# Patient Record
Sex: Male | Born: 1960 | Race: White | Hispanic: No | Marital: Married | Smoking: Former smoker
Health system: Southern US, Community
[De-identification: ages and names within clinical notes are randomized; demographics above are authoritative.]

## PROBLEM LIST (undated history)

## (undated) DIAGNOSIS — J449 Chronic obstructive pulmonary disease, unspecified: Secondary | ICD-10-CM

## (undated) DIAGNOSIS — I10 Essential (primary) hypertension: Secondary | ICD-10-CM

## (undated) DIAGNOSIS — E7800 Pure hypercholesterolemia, unspecified: Secondary | ICD-10-CM

---

## 2005-11-28 ENCOUNTER — Ambulatory Visit: Payer: Self-pay | Admitting: Urology

## 2007-08-17 IMAGING — CR DG CHEST 2V
1 series · 3 of 3 positions shown · non-contrast
Comparison: none

REASON FOR EXAM: TESTICULAR CA
COMMENTS:

[Series 1: view not recorded · 0.17mm/px · 3 of 3 slices shown]
[im 1/3]
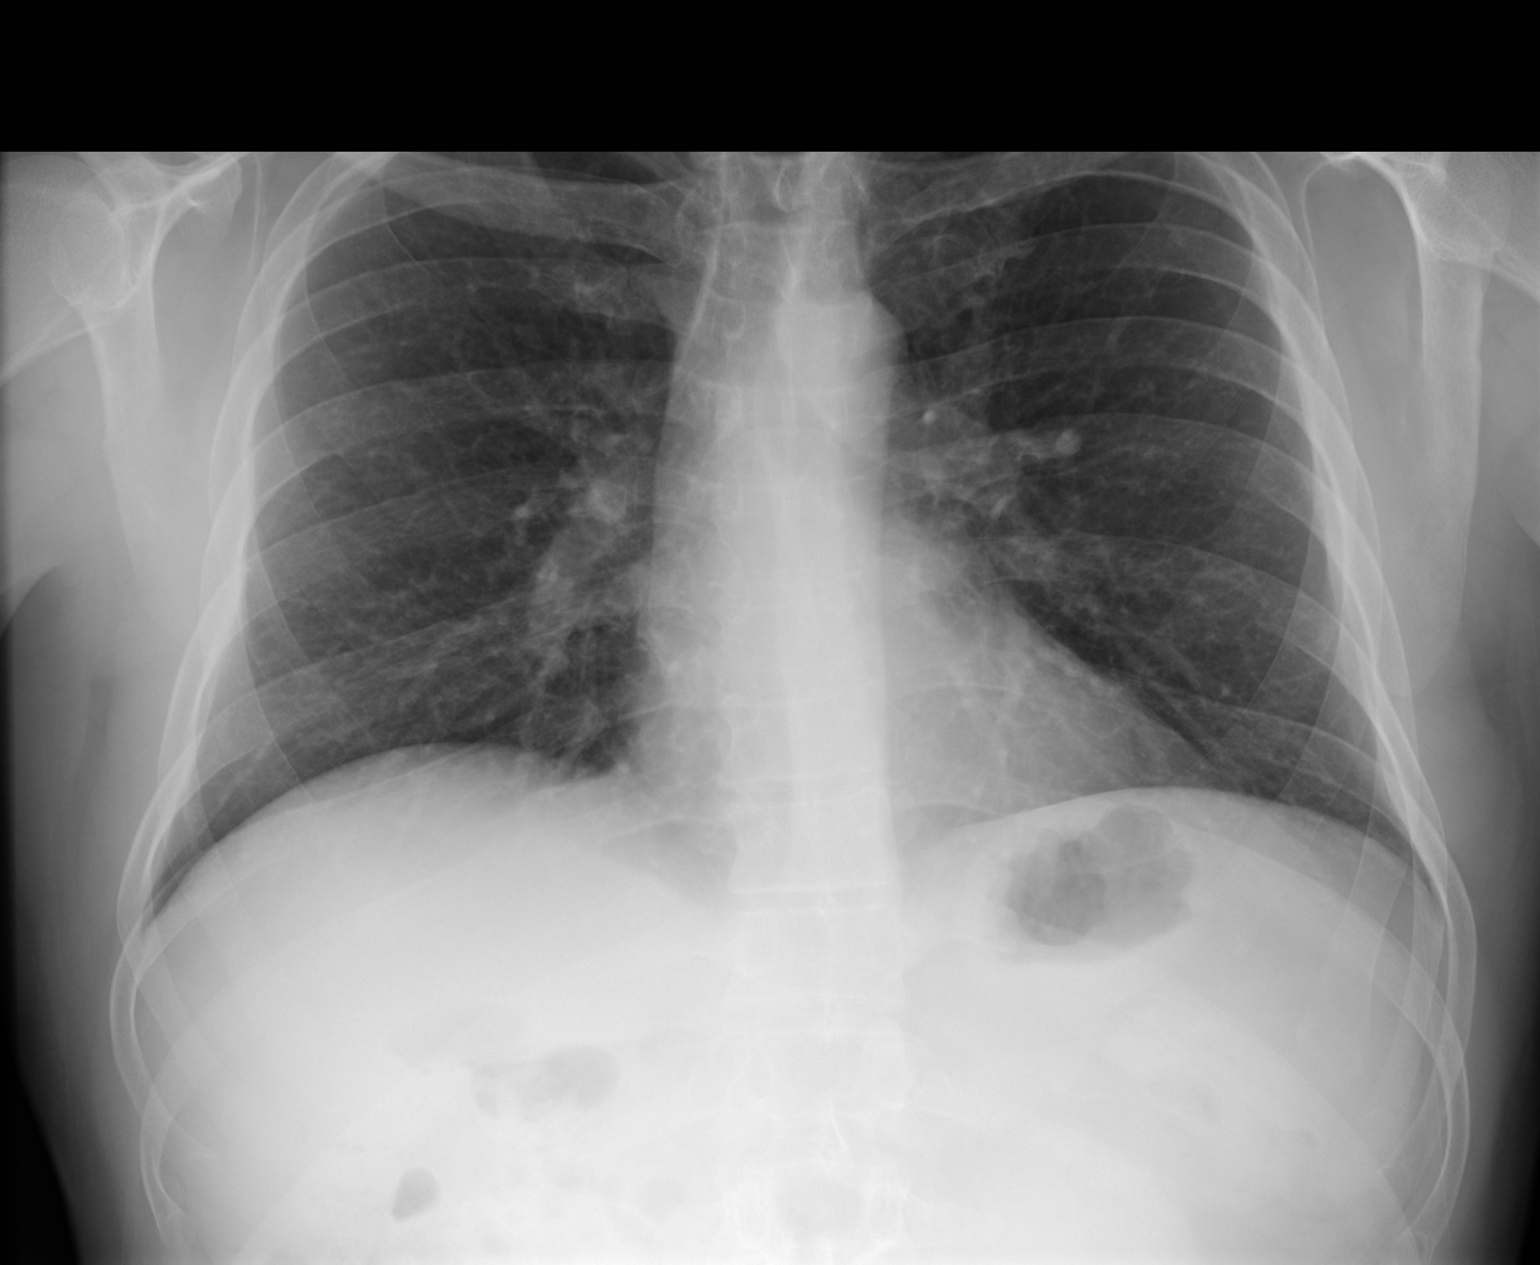
[im 2/3]
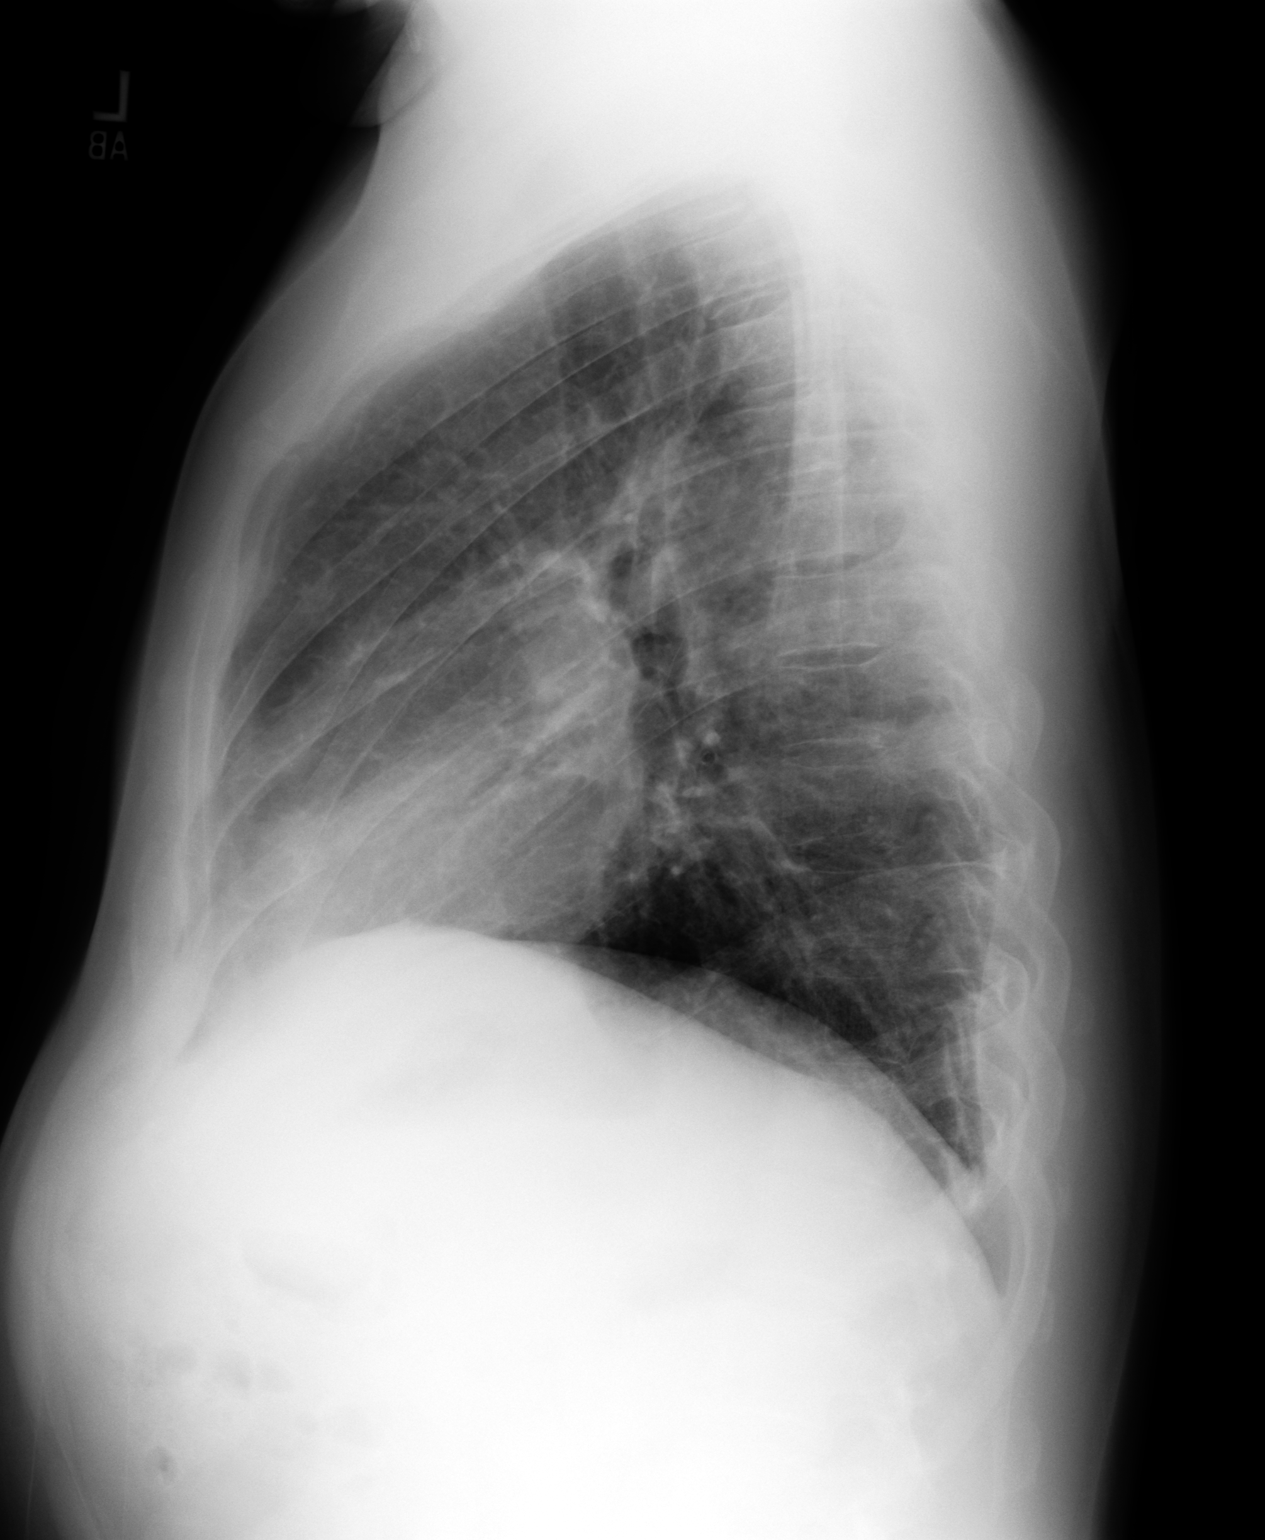
[im 3/3]
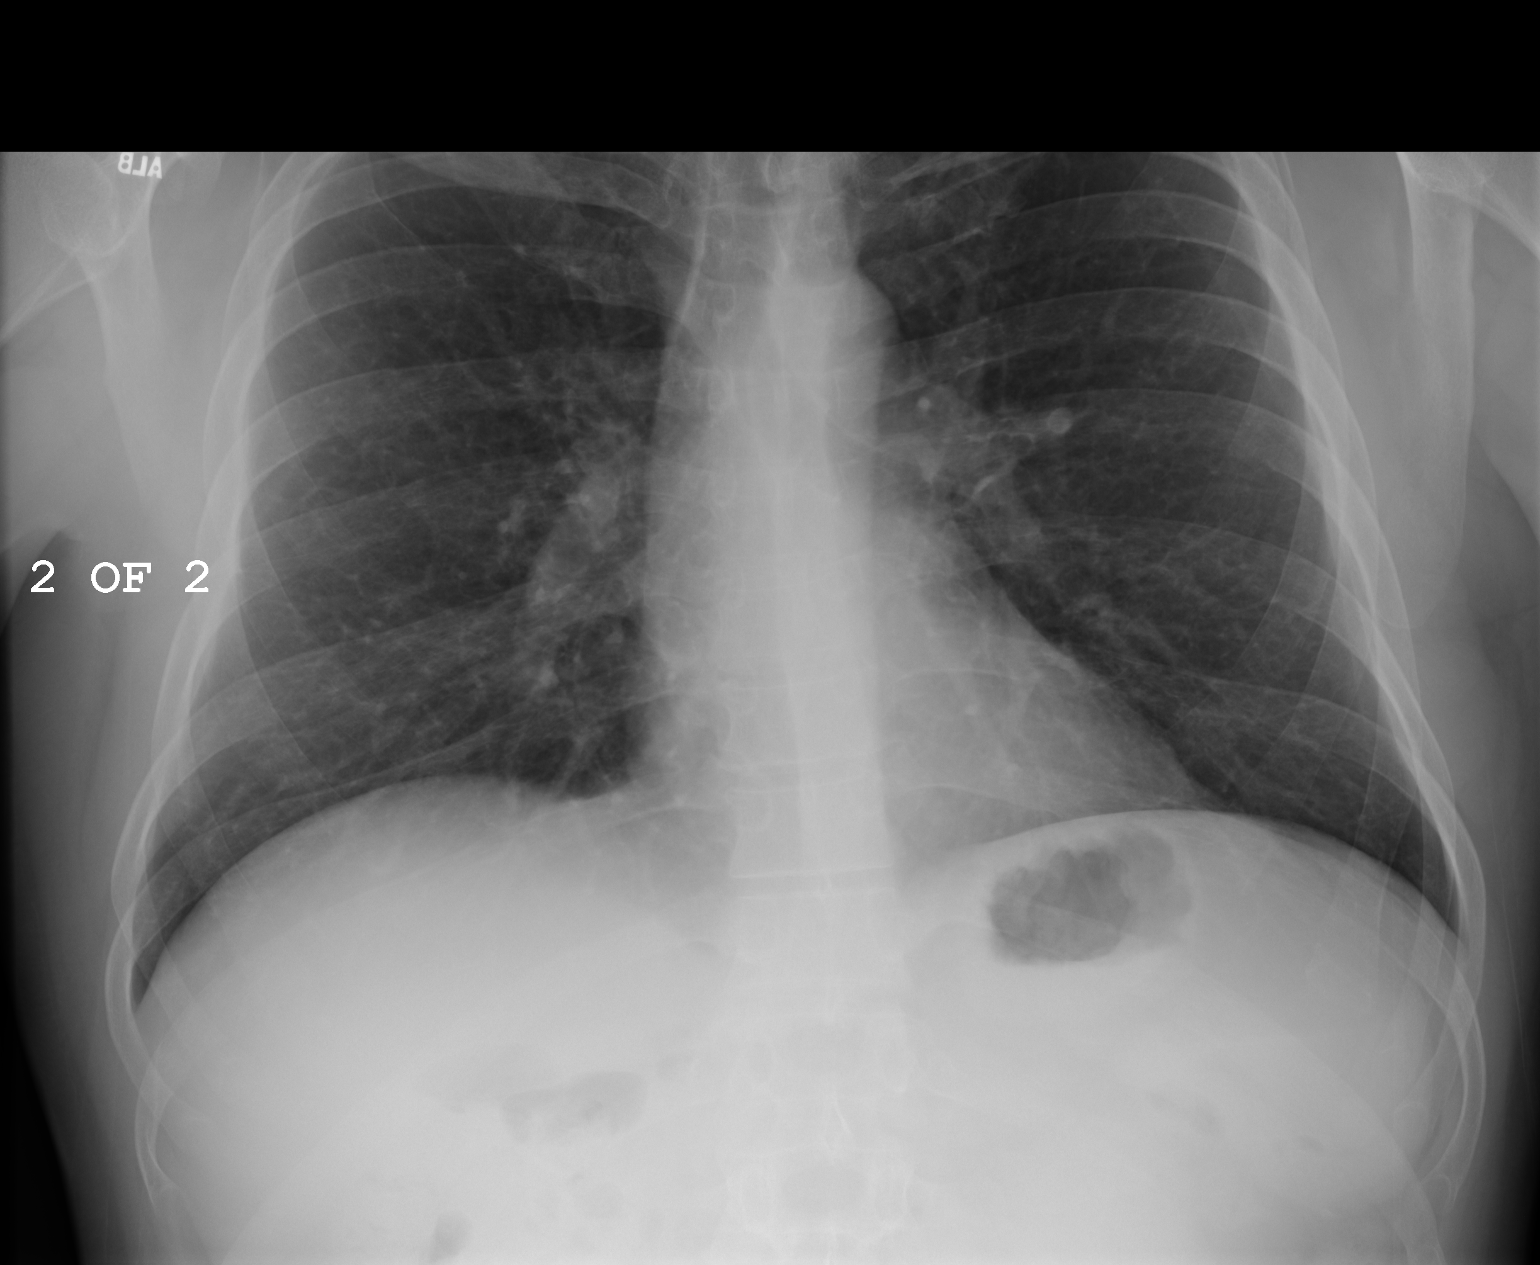

[3 of 3 positions shown; findings below may reference images not displayed]

PROCEDURE:     DXR - DXR CHEST PA (OR AP) AND LATERAL  - November 28, 2005 [DATE]

RESULT:     The lung fields are clear. No pneumonia, pneumothorax or pleural
effusion is seen. No findings suspicious for metastatic disease are
identified. The heart size is normal. The mediastinal and osseous structures
reveal no significant abnormalities.
IMPRESSION: No significant abnormalities are identified.

## 2007-12-27 ENCOUNTER — Ambulatory Visit: Payer: Self-pay | Admitting: Urology

## 2009-09-14 IMAGING — CR DG CHEST 2V
1 series · 2 of 2 positions shown · non-contrast
Comparison: none

REASON FOR EXAM: testicular cancer
COMMENTS:

[Series 1: view not recorded · 0.17mm/px · 2 of 2 slices shown]
[im 1/2]
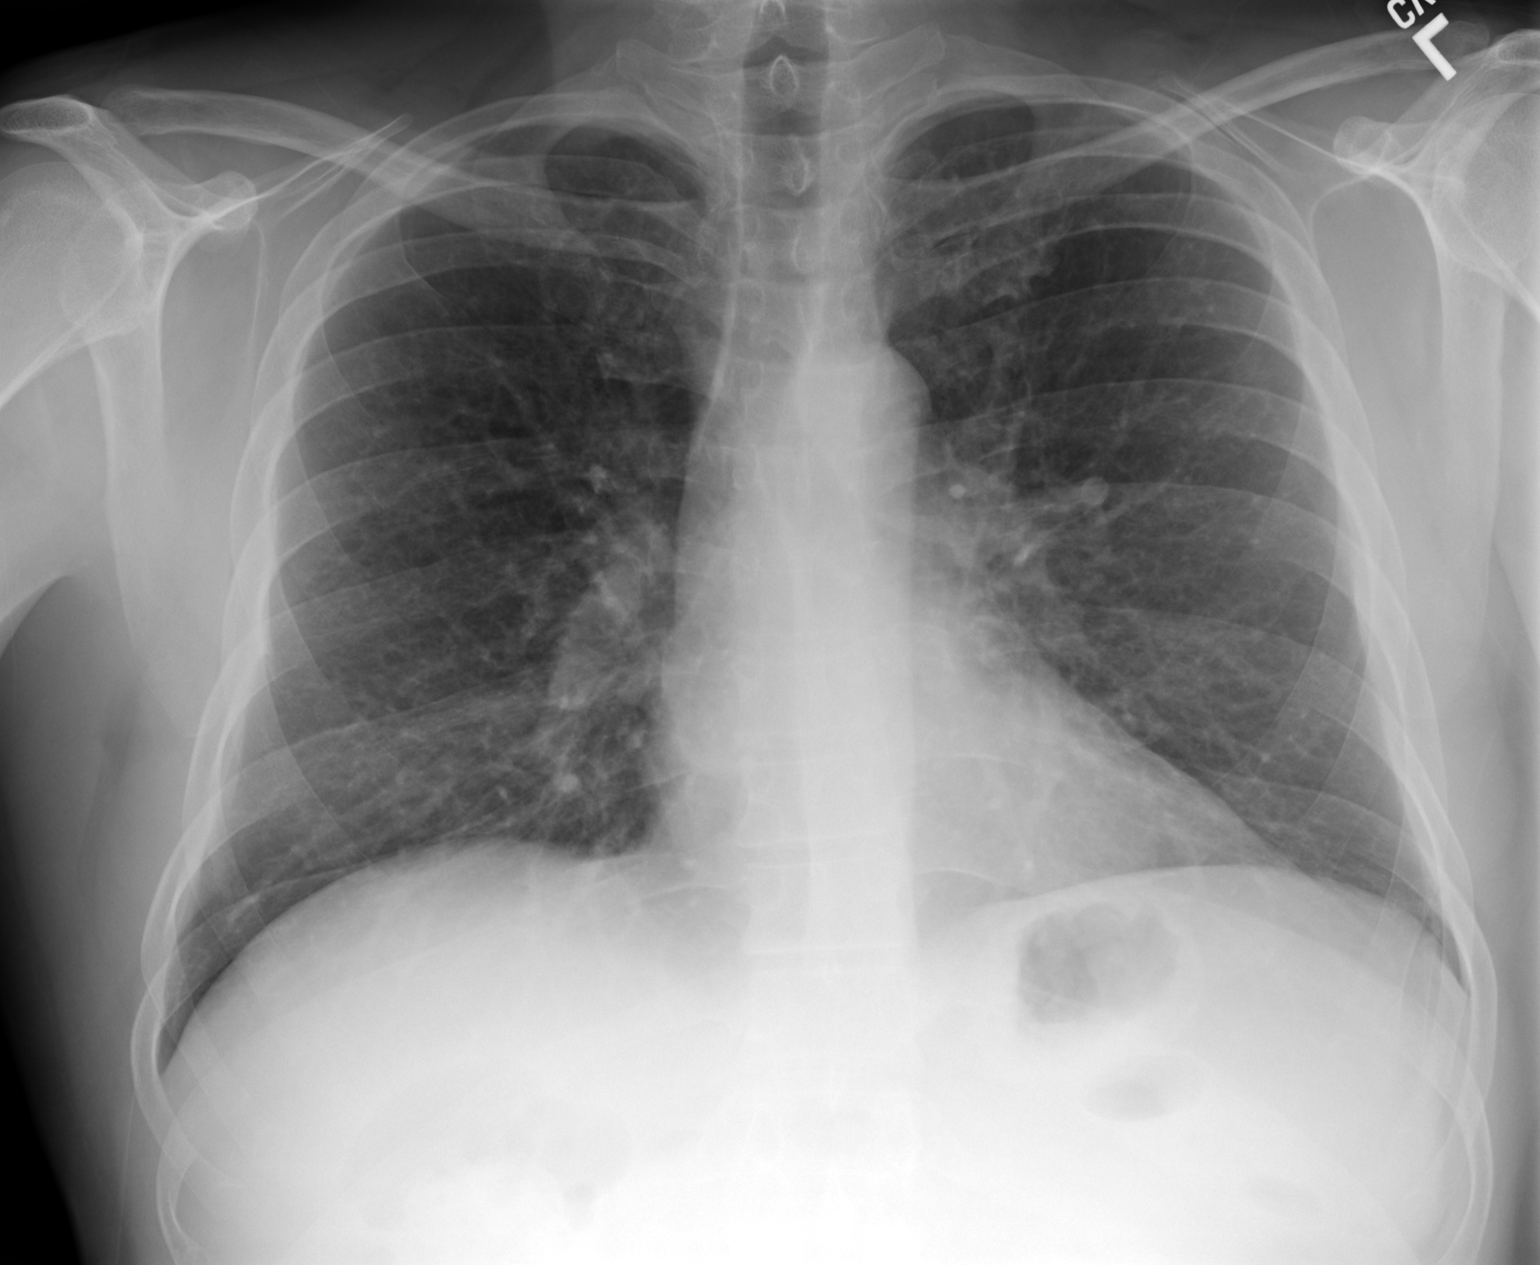
[im 2/2]
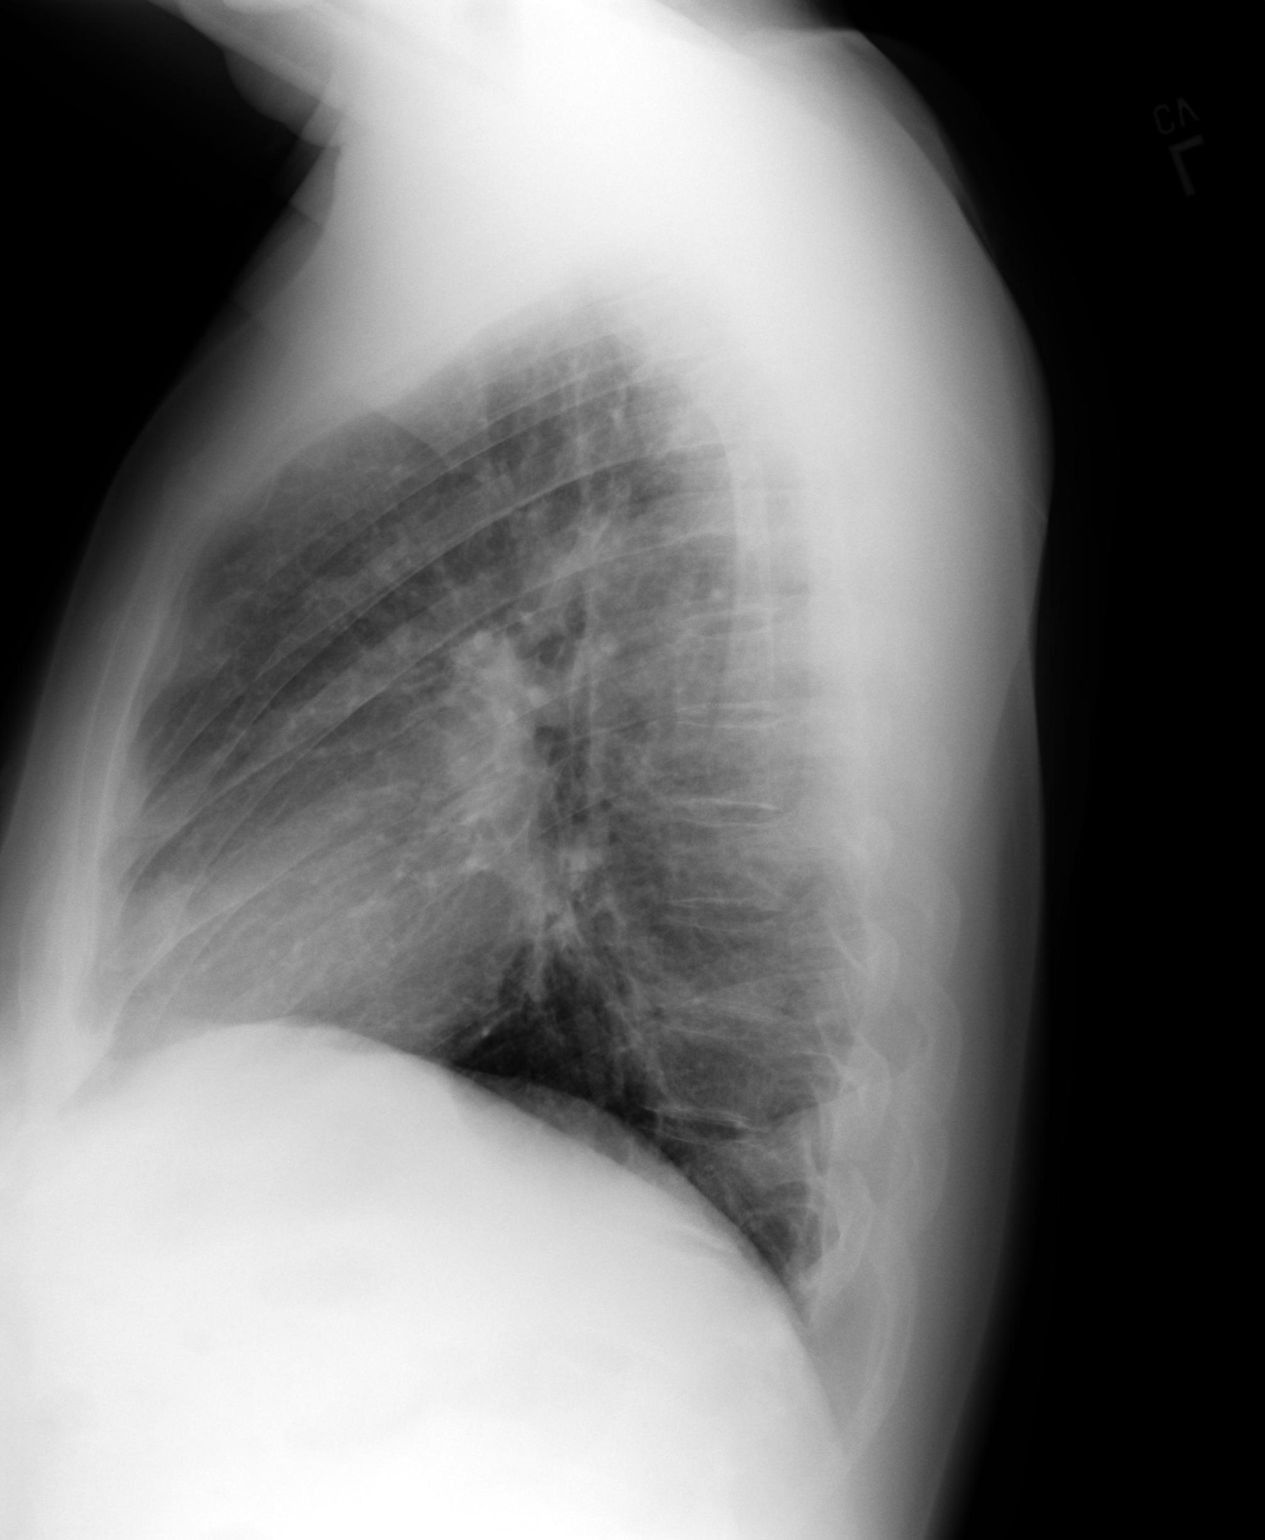

[2 of 2 positions shown; findings below may reference images not displayed]

PROCEDURE:     DXR - DXR CHEST PA (OR AP) AND LATERAL  - December 27, 2007 [DATE]

RESULT:     Comparison is made to the prior exam of 11-28-2005. The lung
fields are clear. No pneumonia, pneumothorax or pleural effusion is seen. No
pulmonary nodularity is identified. Heart size is normal. The mediastinal
and osseous structures show no acute changes.
IMPRESSION: 1. The lung fields are clear.
2. No finding suspicious for metastatic disease is identified.
3. Heart size is normal.

## 2013-11-22 DIAGNOSIS — I10 Essential (primary) hypertension: Secondary | ICD-10-CM | POA: Insufficient documentation

## 2013-11-22 DIAGNOSIS — E7800 Pure hypercholesterolemia, unspecified: Secondary | ICD-10-CM | POA: Insufficient documentation

## 2015-03-04 DIAGNOSIS — E119 Type 2 diabetes mellitus without complications: Secondary | ICD-10-CM | POA: Insufficient documentation

## 2016-03-03 DIAGNOSIS — N529 Male erectile dysfunction, unspecified: Secondary | ICD-10-CM | POA: Insufficient documentation

## 2017-05-19 ENCOUNTER — Ambulatory Visit
Admission: EM | Admit: 2017-05-19 | Discharge: 2017-05-19 | Disposition: A | Discharge status: Home / Self Care | Payer: BC Managed Care – PPO | Attending: Family Medicine | Admitting: Family Medicine

## 2017-05-19 ENCOUNTER — Encounter: Payer: Self-pay | Admitting: *Deleted

## 2017-05-19 DIAGNOSIS — S41111A Laceration without foreign body of right upper arm, initial encounter: Secondary | ICD-10-CM

## 2017-05-19 DIAGNOSIS — Z23 Encounter for immunization: Secondary | ICD-10-CM | POA: Diagnosis not present

## 2017-05-19 DIAGNOSIS — W269XXA Contact with unspecified sharp object(s), initial encounter: Secondary | ICD-10-CM | POA: Diagnosis not present

## 2017-05-19 HISTORY — DX: Pure hypercholesterolemia, unspecified: E78.00

## 2017-05-19 HISTORY — DX: Essential (primary) hypertension: I10

## 2017-05-19 MED ORDER — TETANUS-DIPHTH-ACELL PERTUSSIS 5-2.5-18.5 LF-MCG/0.5 IM SUSP
0.5000 mL | Freq: Once | INTRAMUSCULAR | Status: AC
  Administered 2017-05-19: 0.5 mL via INTRAMUSCULAR

## 2017-05-19 NOTE — ED Provider Notes (Signed)
[facility]    CSN: [id number] Arrival date & time: 05/19/17  1808  History   Chief Complaint Chief Complaint  Patient presents with  . Laceration   HPI  57 year old male presents with a laceration.  Patient was taking out the trash and suffered a laceration of the right  forearm just proximal to the wrist on the ulnar side.  Bleeding is stable at this time.  He is unsure of his last tetanus.  No drainage from the wound.  The wound is being cleaned by nursing staff currently.  He has had no other associated symptoms.  No current reports of pain.  No other reported symptoms.  No other complaints at this time.  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 Doesn't flu w/ oncology anymore after 10 yrs of clearance.  Hypercholesterolemia    Erectile dysfunction 2018   DM (diabetes mellitus), type 2 (CMS-HCC) 2017 A1c climbed to 6.5%; no meds started.   Surgical Hx: ORCHIECTOMY  Right   COLONOSCOPY 01/18/2015  Tubular adenoma of colon/Hyperplastic colon polyp/Repeat 8118months/MGR  COLONOSCOPY 2005     Home Medications    Prior to Admission medications   Not on File    Family History High blood pressure (Hypertension) Father    Lung cancer Father  Multiple mets--bone, brain, adrenal; unsure which was the primary ca  Colon cancer Mother    Stroke Mother  Multiple  Colon polyps Sister     Social History Social History   Tobacco Use  . Smoking status: Current Every Day Smoker    Packs/day: 0.50  . Smokeless tobacco: Never Used  Substance Use Topics  . Alcohol use: Yes    Comment: occasionaly  . Drug use: No   Allergies   Patient has no known allergies.   Review of Systems Review of Systems  Constitutional: Negative.   Skin: Positive for wound.   Physical Exam Triage Vital Signs ED Triage Vitals [05/19/17 1857]    Enc Vitals Group     BP 120/73     Pulse Rate 75     Resp 16     Temp 98.8 F (37.1 C)     Temp Source Oral     SpO2 98 %     Weight      Height      Head Circumference      Peak Flow      Pain Score      Pain Loc      Pain Edu?      Excl. in GC?    Updated Vital Signs BP 120/73 (BP Location: Left Arm)   Pulse 75   Temp 98.8 F (37.1 C) (Oral)   Resp 16   SpO2 98%   Physical Exam  Constitutional: He is oriented to person, place, and time. He appears well-developed. No distress.  Pulmonary/Chest: Effort normal. No respiratory distress.  Neurological: He is alert and oriented to person, place, and time.  Skin:  Approximately 2 cm linear laceration noted on the ulnar side of the forearm just proximal to the wrist.  Psychiatric: He has a normal mood and affect. His behavior is normal.  Nursing note and vitals reviewed.  UC Treatments / Results  Labs (all labs ordered are listed, but only abnormal results are displayed) Labs Reviewed - No data to display  EKG  EKG Interpretation None  Radiology No results found.  Procedures Laceration Repair Date/Time: 05/19/2017 8:04 PM Performed by: Tommie Sams, DO Authorized by: Tommie Sams, DO   Consent:    Consent obtained:  Verbal   Consent given by:  Patient Anesthesia (see MAR for exact dosages):    Anesthesia method:  Local infiltration   Local anesthetic:  Lidocaine 1% WITH epi Laceration details:    Location:  Shoulder/arm   Shoulder/arm location:  R lower arm   Length (cm):  2 Repair type:    Repair type:  Simple Pre-procedure details:    Preparation:  Patient was prepped and draped in usual sterile fashion Exploration:    Hemostasis achieved with:  Epinephrine and direct pressure   Wound exploration: entire depth of wound probed and visualized   Treatment:    Area cleansed with:  Betadine and soap and water Skin repair:    Repair method:  Sutures   Suture size:  4-0   Suture material:   Nylon Approximation:    Approximation:  Close   Vermilion border: well-aligned   Post-procedure details:    Patient tolerance of procedure:  Tolerated well, no immediate complications   (including critical care time)  Medications Ordered in UC Medications  Tdap (BOOSTRIX) injection 0.5 mL (0.5 mLs Intramuscular Given 05/19/17 1858)    Initial Impression / Assessment and Plan / UC Course  I have reviewed the triage vital signs and the nursing notes.  Pertinent labs & imaging results that were available during my care of the patient were reviewed by me and considered in my medical decision making (see chart for details).     57 year old male presents with laceration.  Repaired as above.  Sutures out in 7 days.  Final Clinical Impressions(s) / UC Diagnoses   Final diagnoses:  Laceration of right upper extremity, initial encounter    ED Discharge Orders    None     Controlled Substance Prescriptions [place] Controlled Substance Registry consulted? Not Applicable   Tommie Sams, DO 05/19/17 2005

## 2017-05-19 NOTE — ED Triage Notes (Signed)
Pt cut his right arm as he was pulling his trash this am.

## 2019-07-02 DIAGNOSIS — E039 Hypothyroidism, unspecified: Secondary | ICD-10-CM | POA: Insufficient documentation

## 2020-02-01 DIAGNOSIS — J449 Chronic obstructive pulmonary disease, unspecified: Secondary | ICD-10-CM | POA: Insufficient documentation

## 2020-05-14 ENCOUNTER — Other Ambulatory Visit: Payer: Self-pay | Admitting: Specialist

## 2020-05-14 DIAGNOSIS — R053 Chronic cough: Secondary | ICD-10-CM

## 2020-05-14 DIAGNOSIS — J449 Chronic obstructive pulmonary disease, unspecified: Secondary | ICD-10-CM

## 2020-05-24 ENCOUNTER — Ambulatory Visit: Payer: BC Managed Care – PPO

## 2020-06-04 ENCOUNTER — Ambulatory Visit: Payer: BC Managed Care – PPO

## 2020-08-09 ENCOUNTER — Ambulatory Visit
Admission: EM | Admit: 2020-08-09 | Discharge: 2020-08-09 | Disposition: A | Discharge status: Home / Self Care | Payer: BC Managed Care – PPO | Attending: Family Medicine | Admitting: Family Medicine

## 2020-08-09 ENCOUNTER — Other Ambulatory Visit: Payer: Self-pay

## 2020-08-09 ENCOUNTER — Encounter: Payer: Self-pay | Admitting: Emergency Medicine

## 2020-08-09 DIAGNOSIS — J0100 Acute maxillary sinusitis, unspecified: Secondary | ICD-10-CM | POA: Diagnosis not present

## 2020-08-09 HISTORY — DX: Chronic obstructive pulmonary disease, unspecified: J44.9

## 2020-08-09 MED ORDER — AMOXICILLIN-POT CLAVULANATE 875-125 MG PO TABS: 1.0000 | ORAL_TABLET | Freq: Two times a day (BID) | ORAL | 0 refills | Status: DC

## 2020-08-09 MED ORDER — PROMETHAZINE-DM 6.25-15 MG/5ML PO SYRP: 5.0000 mL | ORAL_SOLUTION | Freq: Four times a day (QID) | ORAL | 0 refills | Status: DC | PRN

## 2020-08-09 NOTE — ED Provider Notes (Signed)
[facility]    CSN: [id number] Arrival date & time: 08/09/20  0809      History   Chief Complaint Chief Complaint  Patient presents with   Cough   Nasal Congestion   Facial Pain   HPI  60 year old male presents with the above complaints.  Patient reports that his symptoms started approximately 1 week ago.  He reports sinus pain and pressure.  Also reports that he is blowing green mucus out of his nose.  He reports a cough.  Denies chest tightness.  He endorses some baseline shortness of breath.  He endorses compliance with his inhalers.  He is not short of breath at this time.  He endorses a fever over the past week but none in the past several days.  Patient is concerned that he has sinusitis.  Past Medical History:  Diagnosis Date   COPD (chronic obstructive pulmonary disease) (HCC)    High cholesterol    Hypertension    Home Medications    Prior to Admission medications   Medication Sig Start Date End Date Taking? Authorizing Provider  albuterol (VENTOLIN HFA) 108 (90 Base) MCG/ACT inhaler Inhale 2 puffs into the lungs every 4 (four) hours as needed. 05/15/20  Yes [provider]  amoxicillin-clavulanate (AUGMENTIN) 875-125 MG tablet Take 1 tablet by mouth 2 (two) times daily. 08/09/20  Yes Bunyan Brier G, DO  aspirin 81 MG EC tablet Take by mouth.   Yes [provider]  atorvastatin (LIPITOR) 10 MG tablet Take 1 tablet by mouth daily. 05/29/14  Yes [provider]  losartan (COZAAR) 25 MG tablet Take 1 tablet by mouth daily. 05/29/14  Yes [provider]  promethazine-dextromethorphan (PROMETHAZINE-DM) 6.25-15 MG/5ML syrup Take 5 mLs by mouth 4 (four) times daily as needed for cough. 08/09/20  Yes Berdine Rasmusson G, DO  umeclidinium-vilanterol (ANORO ELLIPTA) 62.5-25 MCG/INH AEPB Inhale 1 puff by mouth once daily 08/02/20  Yes [provider]    Family History History reviewed. No pertinent family history.  Social  History Social History   Tobacco Use   Smoking status: Former    Packs/day: 0.50    Pack years: 0.00    Types: Cigarettes   Smokeless tobacco: Never  Vaping Use   Vaping Use: Never used  Substance Use Topics   Alcohol use: Yes    Comment: occasionaly   Drug use: No     Allergies   Patient has no known allergies.   Review of Systems Review of Systems  Constitutional:  Positive for fever.  HENT:  Positive for congestion, sinus pressure and sinus pain.   Respiratory:  Positive for cough.    Physical Exam Triage Vital Signs ED Triage Vitals  Enc Vitals Group     BP 08/09/20 0834 133/83     Pulse Rate 08/09/20 0834 61     Resp 08/09/20 0834 18     Temp 08/09/20 0834 98.4 F (36.9 C)     Temp Source 08/09/20 0834 Oral     SpO2 08/09/20 0834 97 %     Weight 08/09/20 0831 195 lb (88.5 kg)     Height 08/09/20 0831 5\' 11"  (1.803 m)     Head Circumference --      Peak Flow --      Pain Score 08/09/20 0831 7     Pain Loc --      Pain Edu? --      Excl. in GC? --    Updated Vital Signs  BP 133/83 (BP Location: Right Arm)   Pulse 61   Temp 98.4 F (36.9 C) (Oral)   Resp 18   Ht 5\' 11"  (1.803 m)   Wt 88.5 kg   SpO2 97%   BMI 27.20 kg/m   Visual Acuity Right Eye Distance:   Left Eye Distance:   Bilateral Distance:    Right Eye Near:   Left Eye Near:    Bilateral Near:     Physical Exam Vitals and nursing note reviewed.  Constitutional:      General: He is not in acute distress.    Appearance: Normal appearance. He is not ill-appearing.  HENT:     Head: Normocephalic and atraumatic.     Right Ear: Tympanic membrane normal.     Left Ear: Tympanic membrane normal.     Nose:     Comments: Maxillary sinus tenderness bilaterally.    Mouth/Throat:     Pharynx: Oropharynx is clear. No oropharyngeal exudate or posterior oropharyngeal erythema.  Cardiovascular:     Rate and Rhythm: Normal rate and regular rhythm.  Pulmonary:     Effort: Pulmonary effort is  normal. No respiratory distress.     Breath sounds: No wheezing or rales.  Neurological:     Mental Status: He is alert.  Psychiatric:        Mood and Affect: Mood normal.        Behavior: Behavior normal.   UC Treatments / Results  Labs (all labs ordered are listed, but only abnormal results are displayed) Labs Reviewed - No data to display  EKG   Radiology No results found.  Procedures Procedures (including critical care time)  Medications Ordered in UC Medications - No data to display  Initial Impression / Assessment and Plan / UC Course  I have reviewed the triage vital signs and the nursing notes.  Pertinent labs & imaging results that were available during my care of the patient were reviewed by me and considered in my medical decision making (see chart for details).    60 year old male presents with acute sinusitis.  Treating with Augmentin.  Promethazine DM for cough.  Patient has no evidence of COPD exacerbation at this time.  He endorses compliance with his home inhalers.  Final Clinical Impressions(s) / UC Diagnoses   Final diagnoses:  Acute maxillary sinusitis, recurrence not specified   Discharge Instructions   None    ED Prescriptions     Medication Sig Dispense Auth. Provider   amoxicillin-clavulanate (AUGMENTIN) 875-125 MG tablet Take 1 tablet by mouth 2 (two) times daily. 20 tablet Trumaine Wimer G, DO   promethazine-dextromethorphan (PROMETHAZINE-DM) 6.25-15 MG/5ML syrup Take 5 mLs by mouth 4 (four) times daily as needed for cough. 118 mL 05-05-1990, DO      PDMP not reviewed this encounter.   Tommie Sams [place], [place] 08/09/20 [phone]

## 2020-08-09 NOTE — ED Triage Notes (Signed)
Patient c/o facial pain and pressure and nasal congestion, cough that started over 1 week ago. Denies fever.

## 2021-08-14 DIAGNOSIS — G4733 Obstructive sleep apnea (adult) (pediatric): Secondary | ICD-10-CM | POA: Insufficient documentation

## 2022-10-08 ENCOUNTER — Encounter: Payer: Self-pay | Admitting: Podiatry

## 2022-10-08 ENCOUNTER — Ambulatory Visit: Payer: BC Managed Care – PPO | Admitting: Podiatry

## 2022-10-08 DIAGNOSIS — L918 Other hypertrophic disorders of the skin: Secondary | ICD-10-CM | POA: Diagnosis not present

## 2022-10-08 NOTE — Progress Notes (Signed)
Subjective:  Patient ID: Tony Wallace, male    DOB: October 24, 1960,  MRN: [id number] HPI Chief Complaint  Patient presents with   Skin Problem    Plantar forefoot right - large skin tag x 10 years, started off very small and has grew over the years, would like removed   New Patient (Initial Visit)    62 y.o. male presents with the above complaint.   ROS: Denies fever chills nausea vomit muscle aches pains calf pain back pain chest pain shortness of breath.  Has a history of codons  Past Medical History:  Diagnosis Date   COPD (chronic obstructive pulmonary disease) (HCC)    High cholesterol    Hypertension    No past surgical history on file.  Current Outpatient Medications:    levothyroxine (SYNTHROID) 100 MCG tablet, Take 1 tablet by mouth 6 days a week as directed. Take on an empty stomach with a glass of water at least 30-60 minutes before breakfast., Disp: , Rfl:    albuterol (VENTOLIN HFA) 108 (90 Base) MCG/ACT inhaler, Inhale 2 puffs into the lungs every 4 (four) hours as needed., Disp: , Rfl:    aspirin 81 MG EC tablet, Take by mouth., Disp: , Rfl:    atorvastatin (LIPITOR) 10 MG tablet, Take 1 tablet by mouth daily., Disp: , Rfl:    losartan (COZAAR) 25 MG tablet, Take 1 tablet by mouth daily., Disp: , Rfl:    sildenafil (REVATIO) 20 MG tablet, Take 20 mg by mouth daily as needed., Disp: , Rfl:    umeclidinium-vilanterol (ANORO ELLIPTA) 62.5-25 MCG/INH AEPB, Inhale 1 puff by mouth once daily, Disp: , Rfl:   No Known Allergies Review of Systems Objective:  There were no vitals filed for this visit.  General: Well developed, nourished, in no acute distress, alert and oriented x3   Dermatological: Skin is warm, dry and supple bilateral. Nails x 10 are well maintained; remaining integument appears unremarkable at this time. There are no open sores, no preulcerative lesions, no rash or signs of infection present.  There is a 2 cm x 2 cm acrochordon to the plantar sulcus  area of the second toe between the first and second toes.  It is callused it is painful it is not ulcerated.  It is sessile not pedunculated.  Vascular: Dorsalis Pedis artery and Posterior Tibial artery pedal pulses are 2/4 bilateral with immedate capillary fill time. Pedal hair growth present. No varicosities and no lower extremity edema present bilateral.   Neruologic: Grossly intact via light touch bilateral. Vibratory intact via tuning fork bilateral. Protective threshold with Semmes Wienstein monofilament intact to all pedal sites bilateral. Patellar and Achilles deep tendon reflexes 2+ bilateral. No Babinski or clonus noted bilateral.   Musculoskeletal: No gross boney pedal deformities bilateral. No pain, crepitus, or limitation noted with foot and ankle range of motion bilateral. Muscular strength 5/5 in all groups tested bilateral.  Gait: Unassisted, Nonantalgic.    Radiographs:  None taken  Assessment & Plan:   Assessment: Painful acrochordon to the plantar aspect of the second toe between the first and second toes right foot.  Plan: Due to its size we discussed removing this in toto and creating a flap for closure.  Consented him for excision of the sinus soft tissue tumor with flap creation.  Also recommended nonweightbearing on this for about 2 weeks.  He understands this and is amenable to it.  We did discuss the possible postop complications which may include are not limited  to postop pain bleeding swell infection recurrence need for further surgery overcorrection under correction loss of digit loss limb loss of life.  We will schedule him soon as possible for this lesion resection.     Tony Wallace T. [place], [place]

## 2022-10-09 ENCOUNTER — Telehealth: Payer: Self-pay | Admitting: Podiatry

## 2022-10-09 ENCOUNTER — Other Ambulatory Visit: Payer: Self-pay | Admitting: Podiatry

## 2022-10-09 MED ORDER — ONDANSETRON HCL 4 MG PO TABS: 4.0000 mg | ORAL_TABLET | Freq: Three times a day (TID) | ORAL | 0 refills | Status: AC | PRN

## 2022-10-09 MED ORDER — CEPHALEXIN 500 MG PO CAPS: 500.0000 mg | ORAL_CAPSULE | Freq: Three times a day (TID) | ORAL | 0 refills | Status: DC

## 2022-10-09 MED ORDER — TRAMADOL HCL 50 MG PO TABS: 50.0000 mg | ORAL_TABLET | Freq: Three times a day (TID) | ORAL | 0 refills | Status: AC | PRN

## 2022-10-09 NOTE — Telephone Encounter (Signed)
DOS 10/10/22  EXCISION OF SKIN TUMOR FOOT RT - 28039  [employer] EFFECTIVE DATE - 03/03/2022  DED - $1500 W/ $761.22 REMAINING OOP - $5900 W/ $5032.19 REMAINING COINS - 30%  PER ANGELA B WITH [employer] INSURANCE NO AUTH IS REQUIRED FOR CPT CODE 62952.   REFERENCE - ANGELA B 10/09/22 9:05 EST

## 2022-10-09 NOTE — Telephone Encounter (Signed)
pt wanted to let you know that he takes an aspirin and that he will not take it before the sugery tomorrow

## 2022-10-10 DIAGNOSIS — L918 Other hypertrophic disorders of the skin: Secondary | ICD-10-CM | POA: Diagnosis not present

## 2022-10-14 ENCOUNTER — Encounter: Payer: BC Managed Care – PPO | Admitting: Podiatry

## 2022-10-22 ENCOUNTER — Ambulatory Visit: Payer: BC Managed Care – PPO | Admitting: Podiatry

## 2022-10-22 ENCOUNTER — Encounter: Payer: Self-pay | Admitting: Podiatry

## 2022-10-22 DIAGNOSIS — L918 Other hypertrophic disorders of the skin: Secondary | ICD-10-CM

## 2022-10-22 DIAGNOSIS — Z9889 Other specified postprocedural states: Secondary | ICD-10-CM

## 2022-10-23 NOTE — Progress Notes (Signed)
He presents with his wife today for excision skin tumor plantar aspect.  States is doing great have not had any problems with it whatsoever.  Objective: Vital signs are stable he is alert and oriented x 3 there is no erythema sutures are in place margins well coapted.  No signs of infection.  Assessment: Well-healing surgical foot.  Plan: I would like for him to remain in his nonweightbearing status for another couple of weeks just so that we can make sure that that plantar incision heals well without scarring.  Will follow-up with him in a week or 2 to remove those stitches.  May need to consider using lidocaine cream.

## 2022-10-29 ENCOUNTER — Ambulatory Visit (INDEPENDENT_AMBULATORY_CARE_PROVIDER_SITE_OTHER): Payer: BC Managed Care – PPO | Admitting: Podiatry

## 2022-10-29 ENCOUNTER — Encounter: Payer: Self-pay | Admitting: Podiatry

## 2022-10-29 DIAGNOSIS — Z9889 Other specified postprocedural states: Secondary | ICD-10-CM

## 2022-10-29 DIAGNOSIS — L918 Other hypertrophic disorders of the skin: Secondary | ICD-10-CM

## 2022-10-30 ENCOUNTER — Encounter: Payer: BC Managed Care – PPO | Admitting: Podiatry

## 2022-10-30 NOTE — Progress Notes (Signed)
He presents today for second postop visit he is status post excision lesion plantar aspect of the right foot.  Date of surgery 10/10/2022.  He states that is doing great no problems whatsoever.  Objective: Wound appears to have gone on to heal uneventfully there is no erythema Dem salines drainage or odor I removed all sutures today margins remain well coapted.  Assessment: Well-healing surgical foot.  Plan: I will follow-up with him in 2 weeks if necessary otherwise he may get back to his regular routine and follow-up with me as needed

## 2022-11-19 ENCOUNTER — Encounter: Payer: BC Managed Care – PPO | Admitting: Podiatry

## 2022-11-20 ENCOUNTER — Encounter: Payer: BC Managed Care – PPO | Admitting: Podiatry
